# Patient Record
Sex: Female | Born: 1985 | Race: Black or African American | Hispanic: No | Marital: Single | Smoking: Current every day smoker
Health system: Southern US, Community
[De-identification: ages and names within clinical notes are randomized; demographics above are authoritative.]

## PROBLEM LIST (undated history)

## (undated) DIAGNOSIS — N611 Abscess of the breast and nipple: Secondary | ICD-10-CM

## (undated) HISTORY — PX: HERNIA REPAIR: SHX51

## (undated) HISTORY — DX: Abscess of the breast and nipple: N61.1

---
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
## 2005-05-20 ENCOUNTER — Emergency Department (HOSPITAL_COMMUNITY): Admission: EM | Admit: 2005-05-20 | Discharge: 2005-05-20 | Payer: Self-pay | Admitting: Emergency Medicine

## 2005-06-05 ENCOUNTER — Emergency Department (HOSPITAL_COMMUNITY): Admission: EM | Admit: 2005-06-05 | Discharge: 2005-06-05 | Payer: Self-pay | Admitting: Emergency Medicine

## 2006-08-10 ENCOUNTER — Emergency Department (HOSPITAL_COMMUNITY): Admission: EM | Admit: 2006-08-10 | Discharge: 2006-08-10 | Payer: Self-pay | Admitting: Family Medicine

## 2006-09-10 ENCOUNTER — Emergency Department (HOSPITAL_COMMUNITY): Admission: EM | Admit: 2006-09-10 | Discharge: 2006-09-10 | Payer: Self-pay | Admitting: Family Medicine

## 2007-04-10 ENCOUNTER — Emergency Department (HOSPITAL_COMMUNITY): Admission: EM | Admit: 2007-04-10 | Discharge: 2007-04-10 | Payer: Self-pay | Admitting: Emergency Medicine

## 2007-05-20 ENCOUNTER — Emergency Department (HOSPITAL_COMMUNITY): Admission: EM | Admit: 2007-05-20 | Discharge: 2007-05-20 | Payer: Self-pay | Admitting: Emergency Medicine

## 2007-07-14 ENCOUNTER — Ambulatory Visit (HOSPITAL_COMMUNITY): Admission: RE | Admit: 2007-07-14 | Discharge: 2007-07-14 | Payer: Self-pay | Admitting: Family Medicine

## 2007-08-04 ENCOUNTER — Ambulatory Visit (HOSPITAL_COMMUNITY): Admission: RE | Admit: 2007-08-04 | Discharge: 2007-08-04 | Payer: Self-pay | Admitting: Obstetrics & Gynecology

## 2007-10-16 ENCOUNTER — Ambulatory Visit: Payer: Self-pay | Admitting: Obstetrics and Gynecology

## 2007-10-16 ENCOUNTER — Inpatient Hospital Stay (HOSPITAL_COMMUNITY): Admission: AD | Admit: 2007-10-16 | Discharge: 2007-10-16 | Payer: Self-pay | Admitting: Family Medicine

## 2008-01-05 ENCOUNTER — Ambulatory Visit: Payer: Self-pay | Admitting: Physician Assistant

## 2008-01-05 ENCOUNTER — Inpatient Hospital Stay (HOSPITAL_COMMUNITY): Admission: AD | Admit: 2008-01-05 | Discharge: 2008-01-09 | Payer: Self-pay | Admitting: Obstetrics & Gynecology

## 2008-08-03 IMAGING — US US OB COMP +14 WK
1 series · 14 of 28 positions shown · non-contrast
Comparison: none

OBSTETRICAL ULTRASOUND:

 This ultrasound exam was performed in the [HOSPITAL] Ultrasound Department.  The OB US report was generated in the AS system, and faxed to the ordering physician.  This report is also available in [REDACTED] PACS.

[Series 1: us ob comp +14 wk · 0.22mm/px · 14 of 39 slices shown]
[im 2/39]
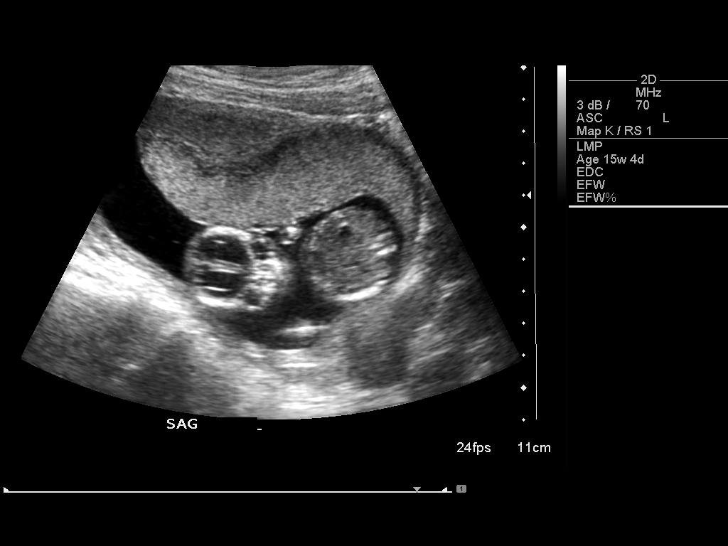
[im 5/39]
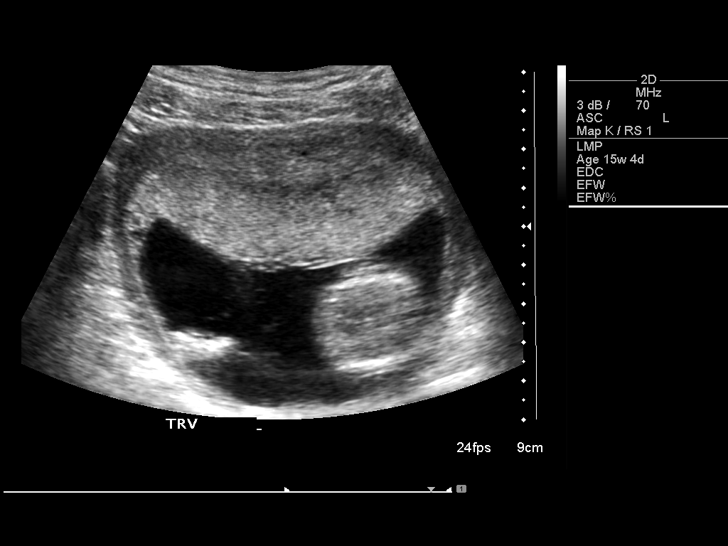
[im 8/39]
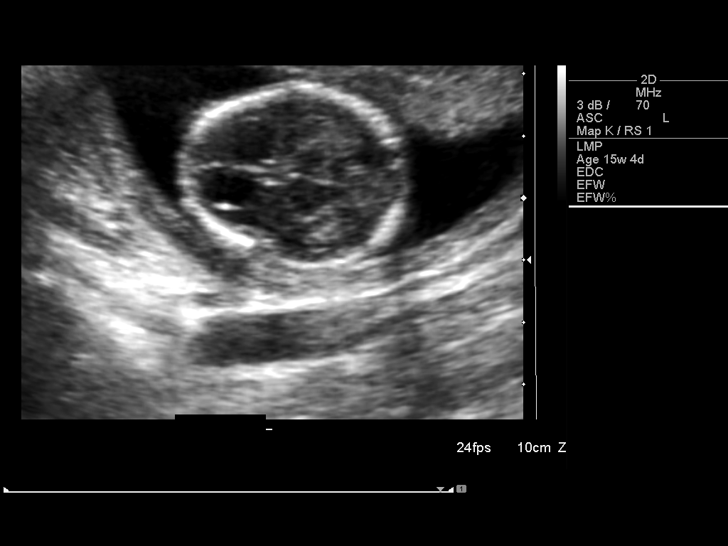
[im 10/39]
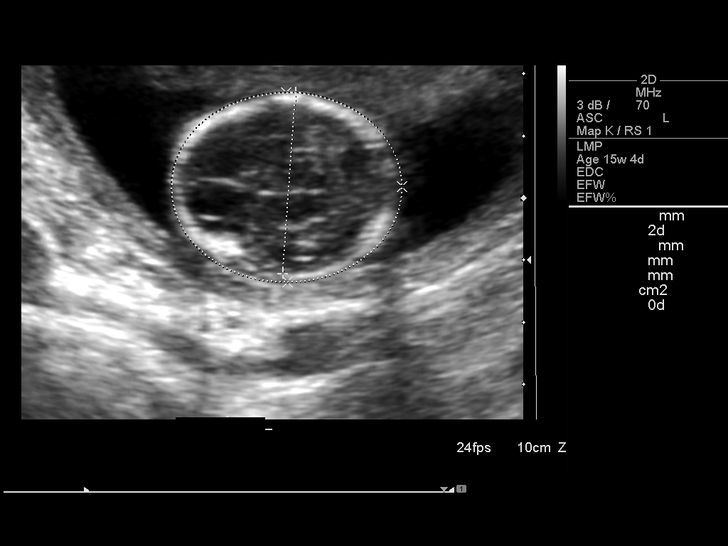
[im 13/39]
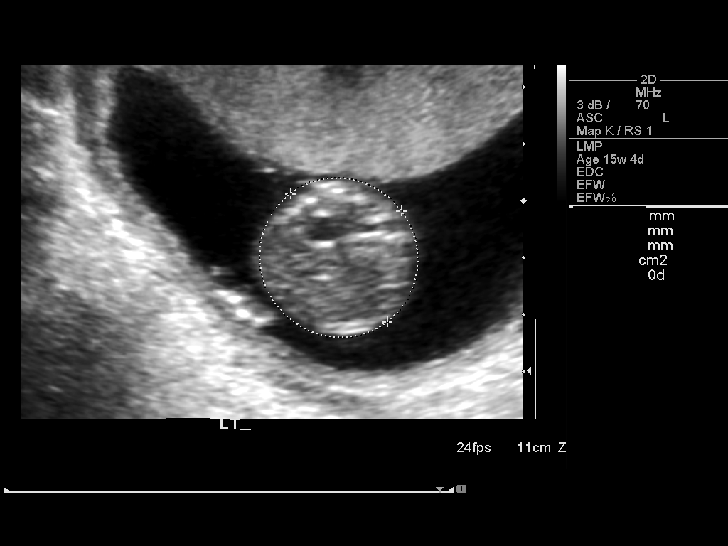
[im 16/39]
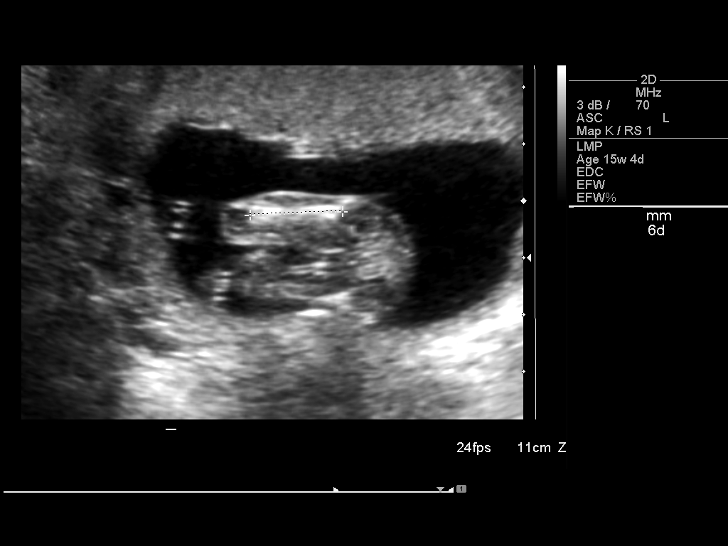
[im 19/39]
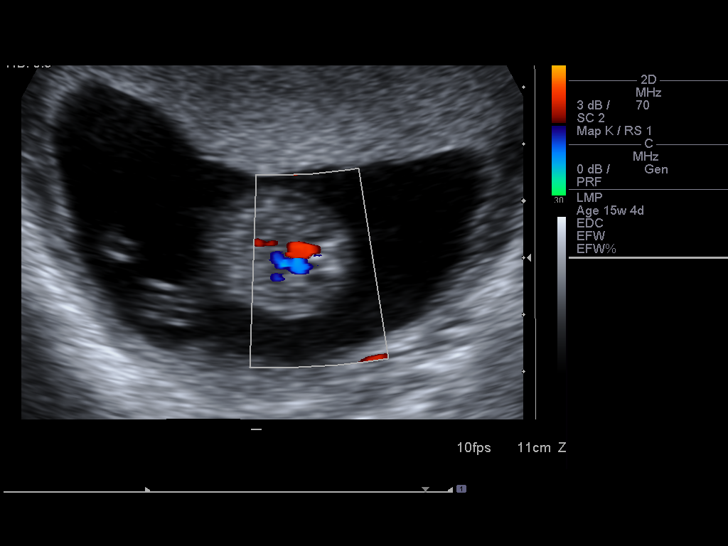
[im 22/39]
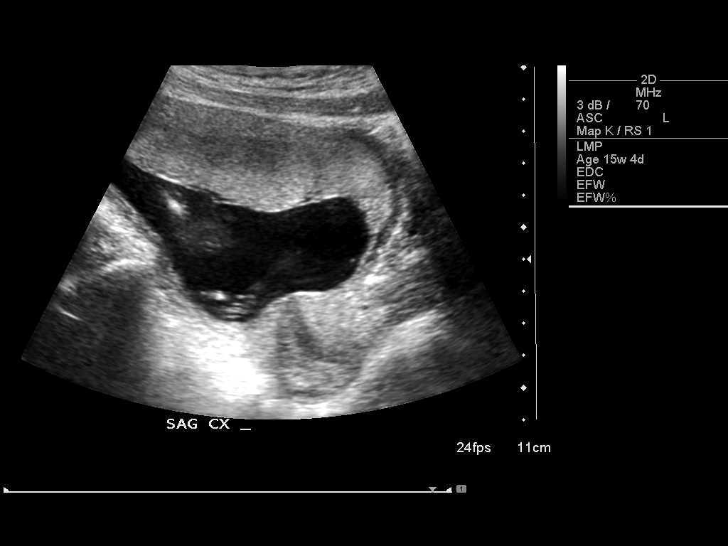
[im 24/39]
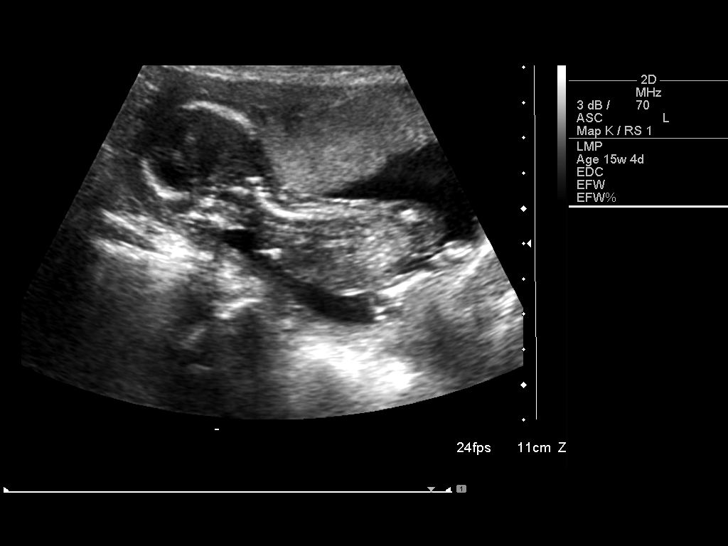
[im 27/39]
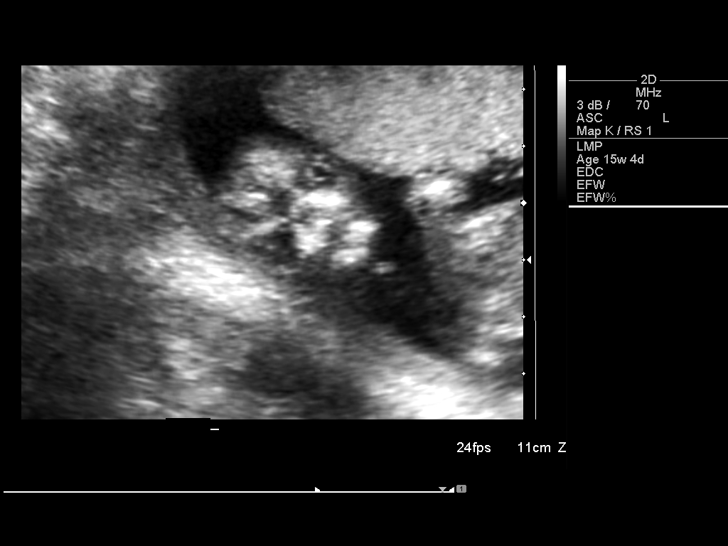
[im 30/39]
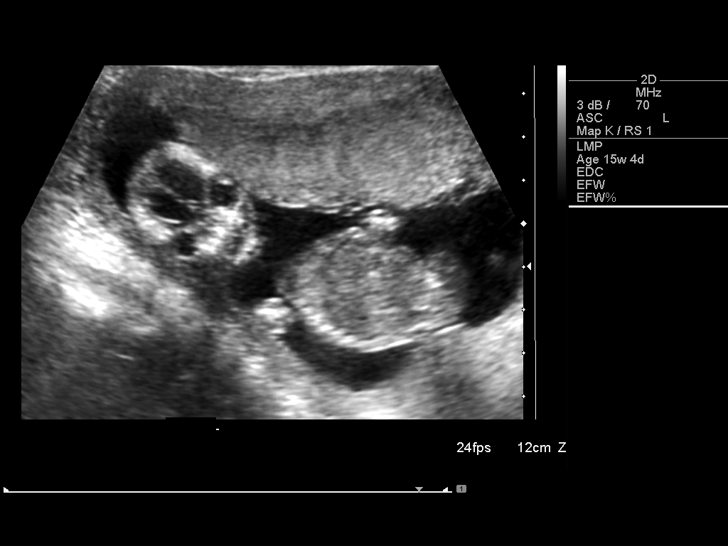
[im 33/39]
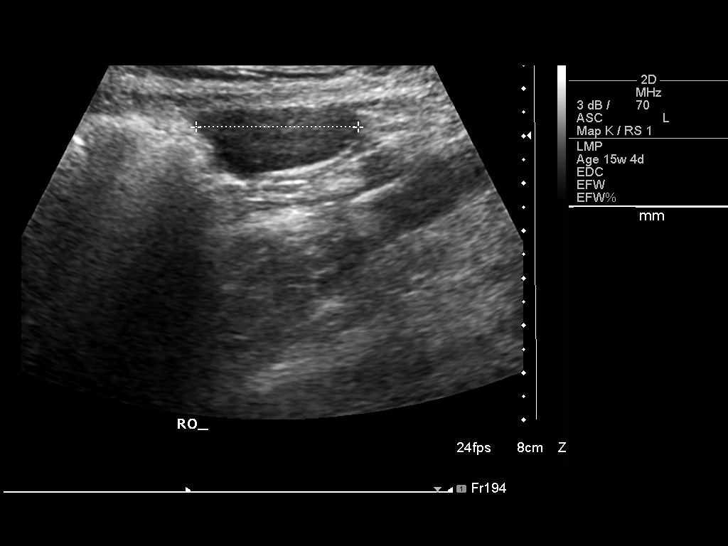
[im 36/39]
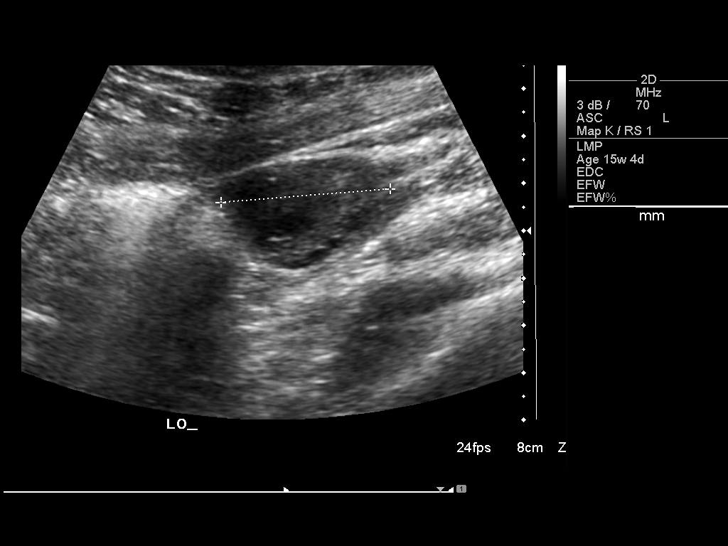
[im 39/39]
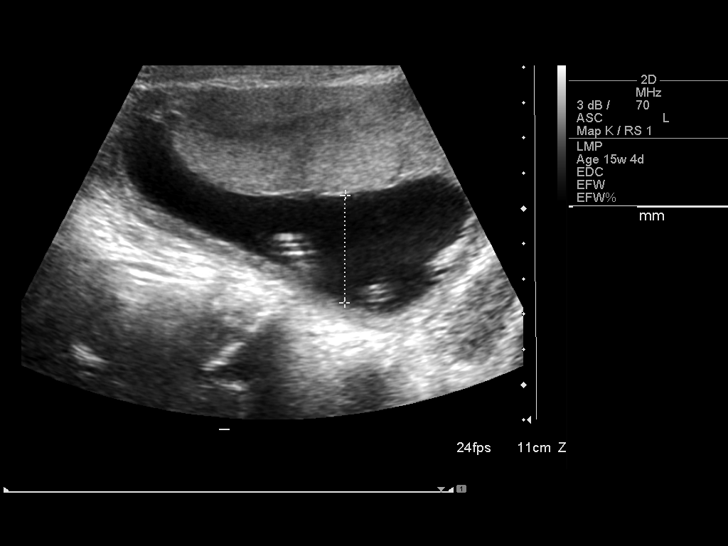

[14 of 28 positions shown; findings below may reference images not displayed]

IMPRESSION: See AS Obstetric US report.

## 2009-01-22 ENCOUNTER — Emergency Department (HOSPITAL_COMMUNITY): Admission: EM | Admit: 2009-01-22 | Discharge: 2009-01-22 | Payer: Self-pay | Admitting: Emergency Medicine

## 2010-11-01 ENCOUNTER — Encounter (HOSPITAL_BASED_OUTPATIENT_CLINIC_OR_DEPARTMENT_OTHER): Payer: Self-pay | Admitting: General Surgery

## 2011-01-21 LAB — CULTURE, ROUTINE-ABSCESS

## 2011-02-24 NOTE — Discharge Summary (Signed)
Dawn Stuart, Dawn Stuart              ACCOUNT NO.:  [id number]   MEDICAL RECORD NO.:  [id number]          PATIENT TYPE:  INP   LOCATION:  9128                          FACILITY:  [facility]   PHYSICIAN:  Allie Bossier, MD        DATE OF BIRTH:  10/23/1985   DATE OF ADMISSION:  01/05/2008  DATE OF DISCHARGE:  01/09/2008                               DISCHARGE SUMMARY   REASON FOR ADMISSION:  Induction of labor.   The patient is a 25 year old G1, P0 who came in for induction, for a  biophysical profile of 4/8.  The patient went on to have a viable female  infant with Apgar's of 8 at one minute and 9 at five minutes via low  forceps vaginal delivery.  This was done under epidural anesthesia.  The  forceps delivery was performed by Dr. Blima Rich.  Three-vessel  cord and placenta delivered spontaneously.  The patient did have a  fourth-degree laceration which was repaired by Dr. Mia Creek with 3-0  Vicryl.  Additionally, a vacuum-assist delivery was attempted but was  unsuccessful and that is why the low forceps delivery was performed.  The patient is bottle feeding her infant.  She plans to use Implanon for  birth control which she will get at her 6-week postpartum visit.   PERTINENT PRENATAL LABORATORY DATA:  Include Rh positive blood type,  rubella immune, and GBS negative.  The patient's discharge hemoglobin is  8.0 and discharge hematocrit 22.9, drawn on January 08, 2008.   PRENATAL PROCEDURES:  Included biophysical profile as previously  mentioned.   INTRAPARTUM PROCEDURES:  As mentioned before, low forceps delivery.   POSTPARTUM PROCEDURES:  None.   COMPLICATIONS:  Fourth-degree perineal laceration, repaired with 3-0  Vicryl.   DISCHARGE DIAGNOSIS:  Term pregnancy, delivered.   DISCHARGE INFORMATION:  Discharge date:  January 09, 2008.  Discharge activity:  Restricted in that the patient is not to have  intercourse for 6 weeks.  Diet:  Routine.  Medications include.  1. Percocet  5/325 one tab q.6 h. p.r.n. moderate-to-severe pain.  2. Ibuprofen 600 mg 1 tablet p.o. q.6 h. p.r.n. mild-to-moderate pain.  3. Senokot 2 tabs p.o. b.i.d. p.r.n. for constipation.  4. Iron 325 mg  p.o. t.i.d. for anemia.  5. Prenatal vitamin 1 tab p.o. daily for the next 6 weeks.   The patient's discharge status is well.  Discharge instructions are  routine.  The patient is discharged home and to follow up [facility] 6 weeks at  the [facility].      Asher Muir, MD      Allie Bossier, MD  Electronically Signed    SO/MEDQ  D:  01/09/2008  T:  01/10/2008  Job:  [id number]

## 2011-04-04 ENCOUNTER — Inpatient Hospital Stay (HOSPITAL_COMMUNITY)
Admission: EM | Admit: 2011-04-04 | Discharge: 2011-04-06 | DRG: 601 | Disposition: A | Discharge status: Home Health | Payer: Self-pay | Attending: General Surgery | Admitting: General Surgery

## 2011-04-04 DIAGNOSIS — F17200 Nicotine dependence, unspecified, uncomplicated: Secondary | ICD-10-CM | POA: Diagnosis present

## 2011-04-04 DIAGNOSIS — N61 Mastitis without abscess: Principal | ICD-10-CM | POA: Diagnosis present

## 2011-04-04 DIAGNOSIS — Z803 Family history of malignant neoplasm of breast: Secondary | ICD-10-CM

## 2011-04-04 DIAGNOSIS — E669 Obesity, unspecified: Secondary | ICD-10-CM | POA: Diagnosis present

## 2011-04-04 HISTORY — PX: BREAST SURGERY: SHX581

## 2011-04-04 LAB — BASIC METABOLIC PANEL
BUN: 3 mg/dL — ABNORMAL LOW (ref 6–23)
CO2: 23 mEq/L (ref 19–32)
Calcium: 8.9 mg/dL (ref 8.4–10.5)
Chloride: 107 mEq/L (ref 96–112)
Creatinine, Ser: 0.52 mg/dL (ref 0.50–1.10)
GFR calc Af Amer: >60 mL/min (ref >60)
GFR calc non Af Amer: >60 mL/min (ref >60)
Glucose, Bld: 106 mg/dL — ABNORMAL HIGH (ref 70–99)
Potassium: 4.2 mEq/L (ref 3.5–5.1)
Sodium: 140 mEq/L (ref 135–145)

## 2011-04-04 LAB — DIFFERENTIAL
Basophils Absolute: 0 10*3/uL (ref 0.0–0.1)
Basophils Relative: 1 % (ref 0–1)
Eosinophils Absolute: 0.2 10*3/uL (ref 0.0–0.7)
Eosinophils Relative: 2 % (ref 0–5)
Lymphocytes Relative: 28 % (ref 12–46)
Lymphs Abs: 1.8 10*3/uL (ref 0.7–4.0)
Monocytes Absolute: 0.5 10*3/uL (ref 0.1–1.0)
Monocytes Relative: 8 % (ref 3–12)
Neutro Abs: 4 10*3/uL (ref 1.7–7.7)
Neutrophils Relative %: 62 % (ref 43–77)

## 2011-04-04 LAB — CBC
HCT: 41.1 % (ref 36.0–46.0)
Hemoglobin: 14.1 g/dL (ref 12.0–15.0)
MCH: 32.6 pg (ref 26.0–34.0)
MCHC: 34.3 g/dL (ref 30.0–36.0)
MCV: 95.1 fL (ref 78.0–100.0)
Platelets: 248 10*3/uL (ref 150–400)
RBC: 4.32 MIL/uL (ref 3.87–5.11)
RDW: 13.5 % (ref 11.5–15.5)
WBC: 6.5 10*3/uL (ref 4.0–10.5)

## 2011-04-05 NOTE — H&P (Signed)
  NAMEPOPPI, SCANTLING NO.:  [id number]  MEDICAL RECORD NO.:  [id number]  LOCATION:  5121                         FACILITY:  [facility]  PHYSICIAN:  Wilmon Arms. Corliss Skains, M.D. DATE OF BIRTH:  04/08/1986  DATE OF ADMISSION:  04/04/2011 DATE OF DISCHARGE:                             HISTORY & PHYSICAL   CHIEF COMPLAINT:  Left breast pain.  HISTORY OF PRESENT ILLNESS:  This is a 25 year old female, who presents with a 2-week history of left breast mass that has been progressively enlarging.  It has developed in the large area with tenderness and erythema.  Over the last couple days, she has had skin breakdown with some purulent drainage.  She presents to the emergency department for evaluation.  PAST MEDICAL HISTORY:  None.  PAST SURGICAL HISTORY:  Umbilical hernia repair.  FAMILY HISTORY:  Her grandmother has had breast cancer, diabetes, and hypertension.  SOCIAL HISTORY:  The patient smokes a pack a day, drinks at least three times a week, smokes marijuana.  REVIEW OF SYSTEMS:  Otherwise negative.  MEDICATIONS:  Aspirin on a p.r.n. basis, Norplant.  ALLERGIES:  None.  PHYSICAL EXAMINATION:  GENERAL:  This is a well-developed, well- nourished female, in no apparent distress. VITAL SIGNS:  She is afebrile with a temperature of 98.2, heart rate 82, blood pressure 130/84, sats 99% NECK:  Larynx are midline. HEENT:  EOMI.  Sclerae anicteric. LUNGS:  Clear to auscultation bilaterally. HEART:  Regular rate and rhythm.  No murmur. BREASTS:  In the left lower outer quadrant left breast, there is a large area of cellulitis with central area of skin necrosis and purulent drainage.  The central area of fluctuance seems to be about 3 cm across.  LABS:  Sodium 140, potassium 4.2, chloride 107, bicarb 23, BUN 3, creatinine 0.52.  White count 6.5, hemoglobin 14.1, platelet count 248.  IMPRESSION:  Left breast abscess with surrounding cellulitis.  PLAN:  We will  proceed to the operating room for incision and drainage of the left breast abscess.  We will also admit the patient for IV antibiotics to the surrounding cellulitis.     Wilmon Arms. Corliss Skains, M.D.     MKT/MEDQ  D:  04/04/2011  T:  04/04/2011  Job:  [id number]  Electronically Signed by Manus Rudd M.D. on 04/05/2011 08:33:51 PM

## 2011-04-05 NOTE — Op Note (Signed)
  NAMEINZA, Dawn Stuart NO.:  [id number]  MEDICAL RECORD NO.:  [id number]  LOCATION:  5121                         FACILITY:  [facility]  PHYSICIAN:  Wilmon Arms. Corliss Skains, M.D. DATE OF BIRTH:  22-Nov-1985  DATE OF PROCEDURE:  04/04/2011 DATE OF DISCHARGE:                              OPERATIVE REPORT   PREOPERATIVE DIAGNOSIS:  Left breast abscess.  POSTOPERATIVE DIAGNOSIS:  Left breast abscess.  PROCEDURE:  Incision and drainage of left breast abscess.  SURGEON:  Wilmon Arms. Ameliarose Shark, MD  ANESTHESIA:  General.  INDICATIONS:  This is a 25 year old female who presents with a 2-week history of an enlarging breast mass that has become infected with some purulent drainage.  She presents now for surgical evaluation and treatment.  DESCRIPTION OF PROCEDURE:  The patient was brought to the operating room, placed in supine position on operating room table.  After an adequate level of general anesthesia was obtained, her left breast was prepped with Betadine and draped in sterile fashion.  Time-out was taken to assure the proper patient, proper procedure.  I opened the purulent area with a hemostat.  The purulent fluid was cultured and sent for microbiology.  I excised a 2-cm circle of necrotic skin.  We entered the abscess cavity.  This seemed to be relatively superficial.  We opened this widely and irrigated thoroughly.  I used a cautery to open the posterior wall abscess cavity and the tissue behind this area seems to be normal in appearance.  There does not seemed to be any deep tracking or further undermining of the abscess.  We inspected carefully for hemostasis.  We thoroughly irrigated with sterile saline.  The wound was packed with saline-soaked gauze.  Dry dressing was applied.  The patient was then extubated, brought to recovery in stable condition.  All sponge, instrument, and needle counts were correct.     Wilmon Arms. Corliss Skains, M.D.     MKT/MEDQ  D:   04/04/2011  T:  04/04/2011  Job:  [id number]  Electronically Signed by Manus Rudd M.D. on 04/05/2011 08:33:48 PM

## 2011-04-07 LAB — CULTURE, ROUTINE-ABSCESS

## 2011-04-09 LAB — ANAEROBIC CULTURE

## 2011-04-10 ENCOUNTER — Telehealth (INDEPENDENT_AMBULATORY_CARE_PROVIDER_SITE_OTHER): Payer: Self-pay | Admitting: General Surgery

## 2011-04-10 NOTE — Telephone Encounter (Signed)
Dawn Stuart from [facility] called to inform us of a delay in service. Dawn Stuart has asked them not to come out until Monday. If you have questions call her. Thanks.

## 2011-04-29 NOTE — Discharge Summary (Signed)
  Dawn Stuart, DRUM NO.:  [id number]  MEDICAL RECORD NO.:  [id number]  LOCATION:  5121                         FACILITY:  [facility]  PHYSICIAN:  Mary Sella. Andrey Campanile, MD     DATE OF BIRTH:  1985-11-10  DATE OF ADMISSION:  04/04/2011 DATE OF DISCHARGE:  04/06/2011                              DISCHARGE SUMMARY   HISTORY OF PRESENT ILLNESS:  Ms. Bracy is a 25 year old female presented with 2-week history of left breast mass, progressively enlarging.  She was seen by Dr. Corliss Skains and found to have an enlarged breast abscess. Decision was made that this would need an incision and drainage and admission for IV antibiotics.  SUMMARY OF HOSPITAL COURSE:  The patient was admitted on April 04, 2011. She had an incision and drainage of the left breast abscess performed by Dr. Corliss Skains, revealing significant purulent fluid, packing was placed. Postoperatively, the patient was placed on antibiotics, was tolerating dressing changes, and ultimately was stable for discharge by postop day #2.  DISCHARGE DIAGNOSES:  Left breast abscess status post incision and drainage.  DISCHARGE MEDICATIONS:  The patient was given a prescription for Bactrim DS 1 tablet twice daily and Percocet 1-2 tablets q.4 hours p.r.n. pain.  She was given instructions regarding dressing changes and asked to follow up in our clinic in approximately 2 weeks or p.r.n.     Brayton El, PA-C   ______________________________ Mary Sella. Andrey Campanile, MD    KB/MEDQ  D:  04/22/2011  T:  04/23/2011  Job:  [id number]  Electronically Signed by Brayton El  on 04/28/2011 04:14:11 PM Electronically Signed by Gaynelle Adu M.D. on 04/29/2011 11:26:00 PM

## 2011-05-12 ENCOUNTER — Encounter (INDEPENDENT_AMBULATORY_CARE_PROVIDER_SITE_OTHER): Payer: Self-pay | Admitting: Surgery

## 2011-05-14 ENCOUNTER — Encounter (INDEPENDENT_AMBULATORY_CARE_PROVIDER_SITE_OTHER): Payer: Self-pay | Admitting: Surgery

## 2011-07-02 LAB — URINALYSIS, ROUTINE W REFLEX MICROSCOPIC
Bilirubin Urine: NEGATIVE
Glucose, UA: NEGATIVE
Hgb urine dipstick: NEGATIVE
Ketones, ur: NEGATIVE
Nitrite: NEGATIVE
Protein, ur: NEGATIVE
Specific Gravity, Urine: <1.005 — ABNORMAL LOW
Urobilinogen, UA: 0.2
pH: 5.5

## 2011-07-02 LAB — FETAL FIBRONECTIN: Fetal Fibronectin: NEGATIVE

## 2011-07-02 LAB — URINE MICROSCOPIC-ADD ON

## 2011-07-06 LAB — CBC
HCT: 22.9 — ABNORMAL LOW
HCT: 26 — ABNORMAL LOW
HCT: 33.5 — ABNORMAL LOW
Hemoglobin: 11.5 — ABNORMAL LOW
Hemoglobin: 8 — ABNORMAL LOW
Hemoglobin: 9.2 — ABNORMAL LOW
MCHC: 34.4
MCHC: 34.9
MCHC: 35.4
MCV: 93.6
MCV: 94.5
MCV: 94.9
Platelets: 144 — ABNORMAL LOW
Platelets: 153
Platelets: 195
RBC: 2.42 — ABNORMAL LOW
RBC: 2.74 — ABNORMAL LOW
RBC: 3.58 — ABNORMAL LOW
RDW: 14
RDW: 14.2
RDW: 14.4
WBC: 15.2 — ABNORMAL HIGH
WBC: 16.8 — ABNORMAL HIGH
WBC: 8.4

## 2011-07-06 LAB — RPR: RPR Ser Ql: NONREACTIVE

## 2011-07-27 LAB — POCT URINALYSIS DIP (DEVICE)
Glucose, UA: 100 — AB
Hgb urine dipstick: NEGATIVE
Nitrite: NEGATIVE
Operator id: 239701
Protein, ur: NEGATIVE
Specific Gravity, Urine: 1.025
Urobilinogen, UA: 1
pH: 5.5

## 2011-07-27 LAB — POCT PREGNANCY, URINE
Operator id: 282151
Preg Test, Ur: POSITIVE

## 2011-07-29 LAB — URINALYSIS, ROUTINE W REFLEX MICROSCOPIC
Bilirubin Urine: NEGATIVE
Glucose, UA: NEGATIVE
Hgb urine dipstick: NEGATIVE
Ketones, ur: NEGATIVE
Nitrite: NEGATIVE
Protein, ur: NEGATIVE
Specific Gravity, Urine: 1.014
Urobilinogen, UA: 1
pH: 6

## 2011-07-29 LAB — COMPREHENSIVE METABOLIC PANEL
AST: 18
Albumin: 4
Alkaline Phosphatase: 64
Chloride: 109
Creatinine, Ser: 0.56
GFR calc Af Amer: >60
Potassium: 4
Total Bilirubin: 0.5
Total Protein: 7.2

## 2011-07-29 LAB — CBC
Platelets: 250
RDW: 14.9 — ABNORMAL HIGH
WBC: 6.8

## 2011-07-29 LAB — URINE CULTURE
Colony Count: NO GROWTH
Culture: NO GROWTH

## 2011-07-29 LAB — DIFFERENTIAL
Basophils Absolute: 0
Eosinophils Relative: 1
Lymphocytes Relative: 28
Monocytes Absolute: 0.4
Monocytes Relative: 5

## 2011-07-29 LAB — PREGNANCY, URINE: Preg Test, Ur: NEGATIVE

## 2011-07-29 LAB — URINE MICROSCOPIC-ADD ON

## 2011-11-25 ENCOUNTER — Emergency Department (HOSPITAL_COMMUNITY)
Admission: EM | Admit: 2011-11-25 | Discharge: 2011-11-25 | Disposition: A | Discharge status: Home / Self Care | Payer: Self-pay | Attending: Emergency Medicine | Admitting: Emergency Medicine

## 2011-11-25 ENCOUNTER — Encounter (HOSPITAL_COMMUNITY): Payer: Self-pay | Admitting: Emergency Medicine

## 2011-11-25 ENCOUNTER — Emergency Department (HOSPITAL_COMMUNITY): Payer: Self-pay

## 2011-11-25 DIAGNOSIS — N61 Mastitis without abscess: Secondary | ICD-10-CM

## 2011-11-25 DIAGNOSIS — F17200 Nicotine dependence, unspecified, uncomplicated: Secondary | ICD-10-CM | POA: Insufficient documentation

## 2011-11-25 LAB — BASIC METABOLIC PANEL
BUN: 4 mg/dL — ABNORMAL LOW (ref 6–23)
Chloride: 105 mEq/L (ref 96–112)
Creatinine, Ser: 0.65 mg/dL (ref 0.50–1.10)
GFR calc Af Amer: >90 mL/min (ref >90)
Glucose, Bld: 99 mg/dL (ref 70–99)
Potassium: 3.8 mEq/L (ref 3.5–5.1)

## 2011-11-25 LAB — URINALYSIS, ROUTINE W REFLEX MICROSCOPIC
Bilirubin Urine: NEGATIVE
Glucose, UA: NEGATIVE mg/dL
Hgb urine dipstick: NEGATIVE
Ketones, ur: NEGATIVE mg/dL
Protein, ur: NEGATIVE mg/dL
Urobilinogen, UA: 0.2 mg/dL (ref 0.0–1.0)

## 2011-11-25 LAB — CBC
HCT: 41.6 % (ref 36.0–46.0)
Hemoglobin: 14.4 g/dL (ref 12.0–15.0)
MCH: 32.7 pg (ref 26.0–34.0)
MCHC: 34.6 g/dL (ref 30.0–36.0)
MCV: 94.3 fL (ref 78.0–100.0)
RDW: 12.9 % (ref 11.5–15.5)

## 2011-11-25 LAB — PREGNANCY, URINE: Preg Test, Ur: NEGATIVE

## 2011-11-25 MED ORDER — SODIUM CHLORIDE 0.9 % IV SOLN
3.0000 g | Freq: Once | INTRAVENOUS | Status: AC
  Administered 2011-11-25: 3 g via INTRAVENOUS
  Filled 2011-11-25: qty 3

## 2011-11-25 MED ORDER — SODIUM CHLORIDE 0.9 % IV BOLUS (SEPSIS)
250.0000 mL | Freq: Once | INTRAVENOUS | Status: AC
  Administered 2011-11-25: 250 mL via INTRAVENOUS

## 2011-11-25 MED ORDER — SODIUM CHLORIDE 0.9 % IV SOLN: INTRAVENOUS | Status: DC

## 2011-11-25 MED ORDER — DOXYCYCLINE HYCLATE 100 MG PO TABS: 100.0000 mg | ORAL_TABLET | Freq: Two times a day (BID) | ORAL | Status: AC

## 2011-11-25 NOTE — ED Provider Notes (Signed)
History     CSN: [id number]  Arrival date & time 11/25/11  1356   First MD Initiated Contact with Patient 11/25/11 1804      Chief Complaint  Patient presents with  . Wound Infection    (Consider location/radiation/quality/duration/timing/severity/associated sxs/prior treatment) HPI  History reviewed. No pertinent past medical history.  Past Surgical History  Procedure Date  . Hernia repair   . Breast surgery   . Breast surgery 04/04/2011    lt breast absess    No family history on file.  History  Substance Use Topics  . Smoking status: Current Everyday Smoker -- 1.0 packs/day    Types: Cigarettes  . Smokeless tobacco: Current User  . Alcohol Use: Yes    OB History    Grav Para Term Preterm Abortions TAB SAB Ect Mult Living                  Review of Systems  Allergies  Review of patient's allergies indicates no known allergies.  Home Medications  No current outpatient prescriptions on file.  BP 123/85  Pulse 80  Temp(Src) 98 F (36.7 C) (Oral)  Resp 15  SpO2 100%  Physical Exam  ED Course  Procedures (including critical care time)   Labs Reviewed  BASIC METABOLIC PANEL  CBC  URINALYSIS, ROUTINE W REFLEX MICROSCOPIC  PREGNANCY, URINE   No results found.   1. Breast infection       MDM  0730 pm Report received from Dr. Deretha Emory. The patient is being moved to CDU waiting on the surgeon to evaluate her left breast mass/infection. Patient is also waiting on ultrasound to evaluate her mass. Dr. Luisa Hart will see the patient in the CDU after he gets out of the OR.  [address] ultrasound shows no abscess. Patient can go home on doxycycline. Followup as instructed by Dr. Luisa Hart.      Jethro Bastos, NP 11/25/11 2156

## 2011-11-25 NOTE — ED Notes (Signed)
Patient to go to ultrasound with NT.

## 2011-11-25 NOTE — ED Notes (Signed)
Pt st's she had a cyst removed from left breast in June, st's started as a abcess that was draining, spent 2 days in hosp for IV antibiotics.  Pt st's area started to become painful over past week and started draining last pm.

## 2011-11-25 NOTE — Discharge Instructions (Signed)
Mastitis   Mastitis is a bacterial infection of the breast tissue.  CAUSES   Bacteria causes infection by entering the breast tissue through cuts or openings in the skin. Typically, this occurs with breastfeeding due to cracked or irritated skin. It can be associated with plugged ducts. Nipple piercing can also lead to mastitis.  SYMPTOMS   In mastitis, an area of the breast becomes swollen, red, tender, and painful. You may notice you have a fever and swelling of the glands under your arm on that side. If the infection is allowed to progress, a collection of pus (abscess) may develop.  DIAGNOSIS   Your caregiver can diagnose mastitis based on your symptoms and upon examination. The diagnosis can be confirmed if pus can be expressed from the breast. This pus can be examined in the lab to determine which bacteria are present. If an abscess has developed, the fluid in the abscess can be removed with a needle. This is used to confirm the diagnosis and determine the bacteria present. In most cases, pus will not be present. Blood tests can be done to determine if your body is fighting a bacterial infection. Sometimes, a mammogram or ultrasound will be recommended to exclude other breast diseases including cancer.  Other rare forms of mastitis:   Tuberculosis mastitis is rare. The TB germ can affect the breast if it is present in some other part of the body. The breast may be slightly tender with a mass, but not tender or painful.   Syphilis of the nipple usually has an ulcer that is not tender.   Actinomycosis is a very rare bacterial infection of the breast that presents as a mass in the breast that is not tender or painful.   Phlebitis (inflammation of blood vessels) of the breast is an inflammation of the veins in the breast. It may be caused by tight fitting bras, surgery, or trauma to the breast.   Inflammatory carcinoma of the breast looks like mastitis because the breasts are red, swollen, or tender, but it  is a rare form of breast cancer.  TREATMENT   Antibiotic medication is used to treat the bacterial infection. Your caregiver will determine which bacteria are most likely to be causing the infection and select an antibiotic. This is sometimes changed based on the results of cultures, or if there is no response to the antibiotic selected. Antibiotics are usually given by mouth. If you are breastfeeding, it is important to continue to empty the breast. Your caregiver can tell you whether or not this milk is safe for your infant, or needs to be thrown away. Pain can usually be treated with medication.  HOME CARE INSTRUCTIONS    Take your antibiotics as directed. Finish them even if you start to feel better.   Only take over-the-counter or prescription medication for pain, discomfort, or fever as directed by your caregiver.   If breastfeeding, keep your nipples clean and dry. Your caregiver may tell you to stop nursing until he or she feels it is safe for your baby. Use a breast pump as instructed if forced to stop nursing.   Do not wear a tight bra. Wear a good support bra.   Empty the first breast completely before going to the other breast. If your baby is not emptying your breasts completely for some reason, use a breast pump to empty your breasts.   If you go back to work, pump your breasts while at work to stay in time   you have a fever.   Avoid having your breasts get overly filled with milk (engorged).  SEEK MEDICAL CARE IF:   You develop pus-like (purulent) discharge from the breast.   Your symptoms get worse.   You do not seem to be responding to your treatment within 2 days.  SEEK IMMEDIATE MEDICAL CARE IF:   You have a fever.   Your pain and swelling is getting worse.   You develop pain that is not controlled with medicine.   You develop a red line extending from the breast toward your armpit.  Document  Released: 09/28/2005 Document Revised: 06/10/2011 Document Reviewed: 05/18/2008 [facility] Patient Information 2012 [place], [place].  Call [facility] [phone]  For appointment to be seen in 7 - 10 days.

## 2011-11-25 NOTE — ED Notes (Signed)
Left breast infection has come back and she is leaking fluid x 1 week

## 2011-11-25 NOTE — Consult Note (Signed)
Reason for Consult:Breast abscess left mastitis Referring Physician: Leyda Stuart is an 26 y.o. female.  HPI:  Dawn Stuart has 1 week history of left breast swelling and redness.  History of left breast abscess last June.  The area is near the nipple and is draining.No fever or chills.  The area is very sore.  History reviewed. No pertinent past medical history.  Past Surgical History  Procedure Date  . Hernia repair   . Breast surgery   . Breast surgery 04/04/2011    lt breast absess    No family history on file.  Social History:  reports that she has been smoking Cigarettes.  She has been smoking about 1 pack per day. She uses smokeless tobacco. She reports that she drinks alcohol. She reports that she uses illicit drugs.  Allergies: No Known Allergies  Medications: I have reviewed the patient's current medications.  Results for orders placed during the hospital encounter of 11/25/11 (from the past 48 hour(s))  BASIC METABOLIC PANEL     Status: Abnormal   Collection Time   11/25/11  7:17 PM      Component Value Range Comment   Sodium 138  135 - 145 (mEq/L)    Potassium 3.8  3.5 - 5.1 (mEq/L)    Chloride 105  96 - 112 (mEq/L)    CO2 24  19 - 32 (mEq/L)    Glucose, Bld 99  70 - 99 (mg/dL)    BUN 4 (*) 6 - 23 (mg/dL)    Creatinine, Ser 9.56  0.50 - 1.10 (mg/dL)    Calcium 21.3  8.4 - 10.5 (mg/dL)    GFR calc non Af Amer >90  >90 (mL/min)    GFR calc Af Amer >90  >90 (mL/min)   CBC     Status: Normal   Collection Time   11/25/11  7:17 PM      Component Value Range Comment   WBC 7.4  4.0 - 10.5 (K/uL)    RBC 4.41  3.87 - 5.11 (MIL/uL)    Hemoglobin 14.4  12.0 - 15.0 (g/dL)    HCT 08.6  57.8 - 46.9 (%)    MCV 94.3  78.0 - 100.0 (fL)    MCH 32.7  26.0 - 34.0 (pg)    MCHC 34.6  30.0 - 36.0 (g/dL)    RDW 62.9  52.8 - 41.3 (%)    Platelets 223  150 - 400 (K/uL)   URINALYSIS, ROUTINE W REFLEX MICROSCOPIC     Status: Abnormal   Collection Time   11/25/11  7:38 PM   Component Value Range Comment   Color, Urine YELLOW  YELLOW     APPearance HAZY (*) CLEAR     Specific Gravity, Urine 1.020  1.005 - 1.030     pH 5.5  5.0 - 8.0     Glucose, UA NEGATIVE  NEGATIVE (mg/dL)    Hgb urine dipstick NEGATIVE  NEGATIVE     Bilirubin Urine NEGATIVE  NEGATIVE     Ketones, ur NEGATIVE  NEGATIVE (mg/dL)    Protein, ur NEGATIVE  NEGATIVE (mg/dL)    Urobilinogen, UA 0.2  0.0 - 1.0 (mg/dL)    Nitrite NEGATIVE  NEGATIVE     Leukocytes, UA NEGATIVE  NEGATIVE  MICROSCOPIC NOT DONE ON URINES WITH NEGATIVE PROTEIN, BLOOD, LEUKOCYTES, NITRITE, OR GLUCOSE <1000 mg/dL.  PREGNANCY, URINE     Status: Normal   Collection Time   11/25/11  7:38 PM  Component Value Range Comment   Preg Test, Ur NEGATIVE  NEGATIVE      [place] Chest  11/25/2011  *RADIOLOGY REPORT*  Clinical Data: A focal area of tenderness and drainage in the left breast.  CHEST ULTRASOUND  Comparison: None.  Findings: No discrete fluid collection is present.  Normal breast tissue is evident.  IMPRESSION: No definite fluid collection or abscess.  Recommend follow-up ultrasound examination at the breast center.  Original Report Authenticated By: Jamesetta Orleans. MATTERN, M.D.    Review of Systems  Constitutional: Negative for fever and chills.  HENT: Negative.   Eyes: Negative.   Respiratory: Negative.   Cardiovascular: Negative.   Gastrointestinal: Negative.   Genitourinary: Negative.   Skin: Negative.   Neurological: Negative.   Endo/Heme/Allergies: Negative.    Blood pressure 113/82, pulse 65, temperature 98 F (36.7 C), temperature source Oral, resp. rate 20, SpO2 100.00%. Physical Exam  Constitutional: She is oriented to person, place, and time. She appears well-developed and well-nourished.  HENT:  Head: Normocephalic and atraumatic.  Eyes: EOM are normal. Pupils are equal, round, and reactive to light.  Neck: Normal range of motion. Neck supple.  Respiratory:       Left breast at 3 oclock at nipple  [place]  [place] 1 cm opening min erythema mod tenderness.  No fluctuance  Musculoskeletal: Normal range of motion.  Neurological: She is alert and oriented to person, place, and time.  Skin: Skin is warm and dry.  Psychiatric: She has a normal mood and affect. Her behavior is normal. Judgment and thought content normal.   Breast U/S  No fluid collection in leftbreast Assessment/Plan: Left breast mastitis with probable drained abscess Discharge home on doxycycline for 10 days.  F/U [facility] 10 days.  [name] A. 11/25/2011, 9:30 PM

## 2011-11-25 NOTE — ED Notes (Signed)
States she is here due to pain in her left breast. Started last week. Had surgery last year (abscess).  Open wound 2cm across. No drainage noted. Rates pain as 6/10.

## 2011-11-25 NOTE — ED Provider Notes (Addendum)
History     CSN: [id number]  Arrival date & time 11/25/11  1356   First MD Initiated Contact with Patient 11/25/11 1804      Chief Complaint  Patient presents with  . Wound Infection    (Consider location/radiation/quality/duration/timing/severity/associated sxs/prior treatment) The history is provided by the patient.   patient is a [age]-year-old female with recurrent left breast infection starting about a week ago started draining yesterday. In June of 2012 patient underwent I&D of abscess in the same area questionable whether breast biopsy was done patient never followed up with [facility] surgery following the operation. Pain is 10 out of 10 at worse the last 24 hours. She's past medical history other than the breast surgery is negative.  History reviewed. No pertinent past medical history.  Past Surgical History  Procedure Date  . Hernia repair   . Breast surgery   . Breast surgery 04/04/2011    lt breast absess    No family history on file.  History  Substance Use Topics  . Smoking status: Current Everyday Smoker -- 1.0 packs/day    Types: Cigarettes  . Smokeless tobacco: Current User  . Alcohol Use: Yes    OB History    Grav Para Term Preterm Abortions TAB SAB Ect Mult Living                  Review of Systems  Constitutional: Negative for fever.  HENT: Negative for congestion, neck pain and neck stiffness.   Eyes: Negative for redness and visual disturbance.  Respiratory: Negative for cough and shortness of breath.   Cardiovascular: Negative for chest pain.  Gastrointestinal: Negative for abdominal pain.  Genitourinary: Negative for dysuria.  Musculoskeletal: Negative for back pain.  Skin: Positive for wound. Negative for rash.  Neurological: Negative for headaches.  Hematological: Negative for adenopathy.    Allergies  Review of patient's allergies indicates no known allergies.  Home Medications  No current outpatient prescriptions on  file.  BP 123/85  Pulse 80  Temp(Src) 98 F (36.7 C) (Oral)  Resp 15  SpO2 100%  Physical Exam  Nursing note and vitals reviewed. Constitutional: She is oriented to person, place, and time. She appears well-developed and well-nourished.  HENT:  Head: Normocephalic and atraumatic.  Mouth/Throat: Oropharynx is clear and moist.  Eyes: Conjunctivae are normal. Pupils are equal, round, and reactive to light.  Neck: Normal range of motion. Neck supple.  Cardiovascular: Normal rate, regular rhythm and normal heart sounds.   No murmur heard. Pulmonary/Chest: Effort normal and breath sounds normal.       Left breast with left lateral chest area all area of excoriated skin no purulent drainage now large area of erythema central area of fluctuance and induration  Abdominal: Soft. Bowel sounds are normal. There is no tenderness.  Musculoskeletal: Normal range of motion.  Neurological: She is alert and oriented to person, place, and time. No cranial nerve deficit. She exhibits normal muscle tone. Coordination normal.  Skin: Skin is warm. No rash noted. There is erythema.    ED Course  Procedures (including critical care time)   Labs Reviewed  BASIC METABOLIC PANEL  CBC   No results found.   1. Breast infection       MDM   CDU recurrent left breast infection status post breast biopsy and abscess drainage back in June of 2012 by [facility] surgery. Infection reoccurred one week ago started draining fluid last night. Following previous breast abscess I&D by general  surgery patient never followed up. Not sure if this biopsy was done but would suspect it was. Will start on Unasyn IV basic labs ordered will have surgery consulted.   Discussed with on call general surgery Dr. Luisa Hart. He is currently in surgery agrees with antibiotic choice of Unasyn and recommends that we get an ultrasound of the left breast to evaluate for distinct abscess or mastitis. He will see the patient in  consultation in the CDU  Medical screening examination/treatment/procedure(s) were conducted as a shared visit with non-physician practitioner(s) and myself.  I personally evaluated the patient during the encounter        Shelda Jakes, MD 11/25/11 1919  Shelda Jakes, MD 11/27/11 [phone]

## 2011-11-27 ENCOUNTER — Telehealth (INDEPENDENT_AMBULATORY_CARE_PROVIDER_SITE_OTHER): Payer: Self-pay | Admitting: General Surgery

## 2011-11-27 NOTE — Telephone Encounter (Signed)
I LEFT A MESSAGE ON PT'S ANSWERING MACHINE RE HIS FOLLOW-UP APPT WITH DR. Davina Poke ON 12-03-11/GY THIS WAS SECOND MESSAGE

## 2011-11-27 NOTE — ED Provider Notes (Signed)
Medical screening examination/treatment/procedure(s) were conducted as a shared visit with non-physician practitioner(s) and myself.  I personally evaluated the patient during the encounter  Orignianll seen by me and placed in CDU pending surgical consult. Unasyn started.   Shelda Jakes, MD 11/27/11 [phone]

## 2011-12-03 ENCOUNTER — Ambulatory Visit (INDEPENDENT_AMBULATORY_CARE_PROVIDER_SITE_OTHER): Payer: Self-pay | Admitting: Surgery

## 2011-12-03 ENCOUNTER — Encounter (INDEPENDENT_AMBULATORY_CARE_PROVIDER_SITE_OTHER): Payer: Self-pay | Admitting: Surgery

## 2011-12-03 VITALS — BP 122/86 | HR 76 | Temp 97.8°F | Resp 20 | Ht 63.0 in | Wt 203.0 lb

## 2011-12-03 DIAGNOSIS — N61 Mastitis without abscess: Secondary | ICD-10-CM

## 2011-12-03 NOTE — Patient Instructions (Signed)
Return if any drainage or pus.

## 2011-12-03 NOTE — Progress Notes (Signed)
Patient returns in followup after being seen in the emergency room on 2 13 124 left breast mastitis and abscess that had already drained on exam. She was placed on doxycycline and sent home. She is doing well.  Exam: Left breast at 3:00 is a 1 cm scab.  There is no redness, fluctuance or drainage.  Impression :  left mastitis which spontaneously drained abscess resolved  Plan: Return to clinic as needed.

## 2012-08-03 ENCOUNTER — Encounter (HOSPITAL_COMMUNITY): Payer: Self-pay

## 2012-08-03 ENCOUNTER — Emergency Department (HOSPITAL_COMMUNITY)
Admission: EM | Admit: 2012-08-03 | Discharge: 2012-08-03 | Disposition: A | Discharge status: Home / Self Care | Payer: Medicaid Other | Attending: Emergency Medicine | Admitting: Emergency Medicine

## 2012-08-03 ENCOUNTER — Emergency Department (HOSPITAL_COMMUNITY)
Admission: EM | Admit: 2012-08-03 | Discharge: 2012-08-03 | Disposition: A | Discharge status: Other Acute Inpatient Hospital | Payer: Medicaid Other | Source: Home / Self Care | Attending: Emergency Medicine | Admitting: Emergency Medicine

## 2012-08-03 DIAGNOSIS — N611 Abscess of the breast and nipple: Secondary | ICD-10-CM

## 2012-08-03 DIAGNOSIS — L0291 Cutaneous abscess, unspecified: Secondary | ICD-10-CM

## 2012-08-03 DIAGNOSIS — N61 Mastitis without abscess: Secondary | ICD-10-CM | POA: Insufficient documentation

## 2012-08-03 DIAGNOSIS — F17200 Nicotine dependence, unspecified, uncomplicated: Secondary | ICD-10-CM | POA: Insufficient documentation

## 2012-08-03 MED ORDER — SULFAMETHOXAZOLE-TRIMETHOPRIM 800-160 MG PO TABS: 1.0000 | ORAL_TABLET | Freq: Two times a day (BID) | ORAL | Status: DC

## 2012-08-03 MED ORDER — OXYCODONE-ACETAMINOPHEN 5-325 MG PO TABS: 1.0000 | ORAL_TABLET | ORAL | Status: DC | PRN

## 2012-08-03 MED ORDER — HYDROCODONE-ACETAMINOPHEN 5-325 MG PO TABS
ORAL_TABLET | ORAL | Status: AC
  Filled 2012-08-03: qty 2

## 2012-08-03 MED ORDER — HYDROCODONE-ACETAMINOPHEN 5-325 MG PO TABS
2.0000 | ORAL_TABLET | Freq: Once | ORAL | Status: AC
  Administered 2012-08-03: 2 via ORAL

## 2012-08-03 MED ORDER — TETANUS-DIPHTH-ACELL PERTUSSIS 5-2.5-18.5 LF-MCG/0.5 IM SUSP
0.5000 mL | Freq: Once | INTRAMUSCULAR | Status: AC
  Administered 2012-08-03: 0.5 mL via INTRAMUSCULAR
  Filled 2012-08-03: qty 0.5

## 2012-08-03 MED ORDER — LIDOCAINE HCL 2 % IJ SOLN
10.0000 mL | Freq: Once | INTRAMUSCULAR | Status: AC
  Administered 2012-08-03: 200 mg via INTRADERMAL

## 2012-08-03 NOTE — ED Notes (Signed)
I&D tray with Lidocaine at bedside. 

## 2012-08-03 NOTE — ED Notes (Signed)
2 week duration of breast pain swelling, reddness

## 2012-08-03 NOTE — ED Notes (Signed)
Pt here from [facility] for eval of abscess on left breast, denies drainage.

## 2012-08-03 NOTE — ED Provider Notes (Signed)
History     CSN: [id number]  Arrival date & time 08/03/12  1344   First MD Initiated Contact with Patient 08/03/12 1406      Chief Complaint  Patient presents with  . Abscess    (Consider location/radiation/quality/duration/timing/severity/associated sxs/prior treatment) Patient is a 26 y.o. female presenting with abscess. The history is provided by the patient. No language interpreter was used.  Abscess  This is a recurrent problem. The current episode started more than one week ago. The onset was gradual. The problem occurs occasionally. The problem has been gradually worsening. Affected Location: L breast. The problem is moderate. The abscess is characterized by redness, painfulness and swelling. The abscess first occurred at home. Pertinent negatives include no fever, no diarrhea and no vomiting.  26 yo female with recurrent L breast abscess.  States that she has had worsening pain and swelling to her L breast aproxomiately the same area that was I & D in the OR 1 year ago  by Dr. Harlon Flor.  Area is approximately 4 cm with surrounding cellulitis.    Past Medical History  Diagnosis Date  . Breast abscess     Left    Past Surgical History  Procedure Date  . Breast surgery   . Breast surgery 04/04/2011    lt breast absess  . Hernia repair     umb    Family History  Problem Relation Age of Onset  . Cancer Maternal Grandmother     breast    History  Substance Use Topics  . Smoking status: Current Every Day Smoker -- 1.0 packs/day    Types: Cigarettes  . Smokeless tobacco: Current User  . Alcohol Use: Yes    OB History    Grav Para Term Preterm Abortions TAB SAB Ect Mult Living                  Review of Systems  Constitutional: Negative.  Negative for fever.  HENT: Negative.   Eyes: Negative.   Respiratory: Negative.   Cardiovascular: Negative.   Gastrointestinal: Negative.  Negative for nausea, vomiting and diarrhea.  Neurological: Negative.     Psychiatric/Behavioral: Negative.   All other systems reviewed and are negative.    Allergies  Review of patient's allergies indicates no known allergies.  Home Medications   Current Outpatient Rx  Name Route Sig Dispense Refill  . IBUPROFEN 200 MG PO TABS Oral Take 400 mg by mouth every 6 (six) hours as needed. For pain      BP 125/87  Pulse 69  Temp 97.7 F (36.5 C) (Oral)  Resp 18  SpO2 97%  Physical Exam  Nursing note and vitals reviewed. Constitutional: She is oriented to person, place, and time. She appears well-developed and well-nourished.  HENT:  Head: Normocephalic and atraumatic.  Eyes: Conjunctivae normal and EOM are normal. Pupils are equal, round, and reactive to light.  Neck: Normal range of motion. Neck supple.  Cardiovascular: Normal rate.   Pulmonary/Chest: Effort normal.  Abdominal: Soft.  Musculoskeletal: Normal range of motion. She exhibits no edema and no tenderness.  Neurological: She is alert and oriented to person, place, and time. She has normal reflexes.  Skin: Skin is warm and dry.       4cm indurated area of abscess with surrounding cellulitis  Psychiatric: She has a normal mood and affect.    ED Course  INCISION AND DRAINAGE Date/Time: 08/03/2012 2:56 PM Performed by: Remi Haggard Authorized by: Remi Haggard Consent: Verbal consent obtained.  Written consent not obtained. Risks and benefits: risks, benefits and alternatives were discussed Consent given by: patient Patient understanding: patient states understanding of the procedure being performed Patient identity confirmed: verbally with patient, arm band, provided demographic data and hospital-assigned identification number Time out: Immediately prior to procedure a "time out" was called to verify the correct patient, procedure, equipment, support staff and site/side marked as required. Type: abscess Location: L breast. Local anesthetic: lidocaine 2% without  epinephrine Anesthetic total: 10 ml Patient sedated: no Scalpel size: 11 Needle gauge: 22 Incision type: single straight Complexity: simple Drainage: purulent Drainage amount: copious Wound treatment: wound left open Packing material: 1/4 in iodoform gauze Patient tolerance: Patient tolerated the procedure well with no immediate complications.   Spoke with Dr. Bettey Mare PA and he advised to drain and follow up with Dr. Harlon Flor in 2 weeks.  Patient will be instructed to pull the packing out herself tomorrow.  Appointment set for Nov 6 at 1:30pm.       (including critical care time)  Labs Reviewed - No data to display No results found.   No diagnosis found.    MDM  Large 4-5cm abscess to L breast I & D with purulent drainage copious amount. Patient instructed to pull packing out tomorrow, use hot compresses and squeeze 3-4 times a day.  Start bactrim today.  Use ibuprofen and percocet for pain.  Return for high fever, n/v.          Remi Haggard, NP 08/03/12 1701

## 2012-08-03 NOTE — ED Provider Notes (Addendum)
History     CSN: [id number]  Arrival date & time 08/03/12  1109   First MD Initiated Contact with Patient 08/03/12 1302      Chief Complaint  Patient presents with  . Breast Problem    (Consider location/radiation/quality/duration/timing/severity/associated sxs/prior treatment) HPI Comments: (Delayed entry, my apololigies). Did performed a transfer note prior, sending patient to the ED, WITH THE INTENTION OF CONSULTATION WITH SURGICAL TEAM  Dawn Stuart presented to [facility] complaining of swelling, tenderness, redness and purulent discharge for about 2 week duration from her left breast  I cant take it anymore..she has been applying warm compresses and trying at home with topical antibiotics, at this point she can barely touch her breast and pain is exacerbated with minimal movements..She denies any recent trauma or skin lesions, fevers, chills or headaches. She brings to our attention that she had to see a surgeon in the past almost a year ago, for another abscessed that was surgically intervened.  The history is provided by the patient.    Past Medical History  Diagnosis Date  . Breast abscess     Left    Past Surgical History  Procedure Date  . Breast surgery   . Breast surgery 04/04/2011    lt breast absess  . Hernia repair     umb    Family History  Problem Relation Age of Onset  . Cancer Maternal Grandmother     breast    History  Substance Use Topics  . Smoking status: Current Every Day Smoker -- 1.0 packs/day    Types: Cigarettes  . Smokeless tobacco: Current User  . Alcohol Use: Yes    OB History    Grav Para Term Preterm Abortions TAB SAB Ect Mult Living                  Review of Systems  Constitutional: Negative for fever, chills, activity change and appetite change.  Skin: Positive for color change and wound.    Allergies  Review of patient's allergies indicates no known allergies.  Home Medications   Current Outpatient Rx  Name Route Sig  Dispense Refill  . IBUPROFEN 200 MG PO TABS Oral Take 400 mg by mouth every 6 (six) hours as needed. For pain    . OXYCODONE-ACETAMINOPHEN 5-325 MG PO TABS Oral Take 1 tablet by mouth every 4 (four) hours as needed for pain. 15 tablet 0  . SULFAMETHOXAZOLE-TRIMETHOPRIM 800-160 MG PO TABS Oral Take 1 tablet by mouth every 12 (twelve) hours. 20 tablet 0    BP 131/82  Pulse 75  Temp 99.6 F (37.6 C) (Oral)  Resp 24  SpO2 100%  Physical Exam  Nursing note and vitals reviewed. Constitutional: She appears well-developed and well-nourished. She appears distressed.  Pulmonary/Chest: Left breast exhibits skin change and tenderness. Breasts are asymmetrical.    Skin: There is erythema.    ED Course  Procedures (including critical care time)  Labs Reviewed - No data to display No results found.   1. Breast abscess   2. Cellulitis and abscess of breast       MDM  Extensive cellulitis and breast abscess with "skin de orange appearance" transfer to the ED for surgical consult.        Jimmie Molly, MD 08/03/12 1350  Jimmie Molly, MD 08/04/12 [phone]

## 2012-08-03 NOTE — ED Notes (Signed)
Pt given extra supplies for dressing changes at home. Pt verbalized understanding of followup appt.

## 2012-08-04 NOTE — ED Provider Notes (Signed)
Medical screening examination/treatment/procedure(s) were performed by non-physician practitioner and as supervising physician I was immediately available for consultation/collaboration.  Jalaya Sarver T Hemi Chacko, MD 08/04/12 2228 

## 2012-08-17 ENCOUNTER — Ambulatory Visit (INDEPENDENT_AMBULATORY_CARE_PROVIDER_SITE_OTHER): Payer: Medicaid Other | Admitting: Surgery

## 2012-08-17 ENCOUNTER — Encounter (INDEPENDENT_AMBULATORY_CARE_PROVIDER_SITE_OTHER): Payer: Self-pay | Admitting: Surgery

## 2012-08-17 VITALS — BP 126/74 | HR 72 | Temp 97.3°F | Resp 16 | Ht 63.0 in | Wt 190.0 lb

## 2012-08-17 DIAGNOSIS — N611 Abscess of the breast and nipple: Secondary | ICD-10-CM | POA: Insufficient documentation

## 2012-08-17 DIAGNOSIS — N61 Mastitis without abscess: Secondary | ICD-10-CM

## 2012-08-17 NOTE — Progress Notes (Signed)
This patient is status post incision and drainage of a very large left breast abscess in June of 2012. She had a small recurrence in favor of this year which resolved spontaneously. One month ago she presented to the emergency department with a very swollen erythematous left breast. This was drained in the emergency department and packed with gauze. The gauze has been removed and the wound is healed. She still has a 2 and half centimeter area of firmness just under the skin. Overall the breast feels much better.  Filed Vitals:   08/17/12 1340  BP: 126/74  Pulse: 72  Temp: 97.3 F (36.3 C)  Resp: 16   Her left breast shows a healing incision. No surrounding cellulitis. The deep part of the breast feels soft and nontender. She has a 2 cm area of thickening just under the incision. It only goes about 1 cm deep.  Impression recurrent left breast abscess  Recommendations: Recommend wide excision of the remaining scar tissue as it appears to contain a chronic abscess. We can perform this under local anesthetic in the office. We'll schedule this for next week.The surgical procedure has been discussed with the patient.  Potential risks, benefits, alternative treatments, and expected outcomes have been explained.  All of the patient's questions at this time have been answered.  The likelihood of reaching the patient's treatment goal is good.  The patient understand the proposed surgical procedure and wishes to proceed.   Wilmon Arms. Corliss Skains, MD, [facility]  08/17/2012 2:04 PM

## 2012-08-23 ENCOUNTER — Ambulatory Visit (INDEPENDENT_AMBULATORY_CARE_PROVIDER_SITE_OTHER): Payer: Medicaid Other | Admitting: Surgery

## 2012-08-25 ENCOUNTER — Telehealth (INDEPENDENT_AMBULATORY_CARE_PROVIDER_SITE_OTHER): Payer: Self-pay

## 2012-08-25 NOTE — Telephone Encounter (Signed)
Close encounter 

## 2012-08-29 ENCOUNTER — Ambulatory Visit (INDEPENDENT_AMBULATORY_CARE_PROVIDER_SITE_OTHER): Payer: Medicaid Other | Admitting: General Surgery

## 2012-09-13 ENCOUNTER — Ambulatory Visit (INDEPENDENT_AMBULATORY_CARE_PROVIDER_SITE_OTHER): Payer: Medicaid Other | Admitting: Surgery

## 2012-10-13 ENCOUNTER — Encounter (INDEPENDENT_AMBULATORY_CARE_PROVIDER_SITE_OTHER): Payer: Self-pay | Admitting: Surgery

## 2014-06-22 ENCOUNTER — Emergency Department (HOSPITAL_COMMUNITY)
Admission: EM | Admit: 2014-06-22 | Discharge: 2014-06-22 | Disposition: A | Discharge status: Home / Self Care | Payer: Medicaid Other | Attending: Emergency Medicine | Admitting: Emergency Medicine

## 2014-06-22 ENCOUNTER — Encounter (HOSPITAL_COMMUNITY): Payer: Self-pay | Admitting: Emergency Medicine

## 2014-06-22 DIAGNOSIS — R21 Rash and other nonspecific skin eruption: Secondary | ICD-10-CM | POA: Diagnosis present

## 2014-06-22 DIAGNOSIS — Z8742 Personal history of other diseases of the female genital tract: Secondary | ICD-10-CM | POA: Insufficient documentation

## 2014-06-22 DIAGNOSIS — IMO0002 Reserved for concepts with insufficient information to code with codable children: Secondary | ICD-10-CM | POA: Diagnosis not present

## 2014-06-22 DIAGNOSIS — X58XXXA Exposure to other specified factors, initial encounter: Secondary | ICD-10-CM | POA: Diagnosis not present

## 2014-06-22 DIAGNOSIS — S20319A Abrasion of unspecified front wall of thorax, initial encounter: Secondary | ICD-10-CM

## 2014-06-22 DIAGNOSIS — Y939 Activity, unspecified: Secondary | ICD-10-CM | POA: Insufficient documentation

## 2014-06-22 DIAGNOSIS — Y929 Unspecified place or not applicable: Secondary | ICD-10-CM | POA: Insufficient documentation

## 2014-06-22 DIAGNOSIS — L089 Local infection of the skin and subcutaneous tissue, unspecified: Secondary | ICD-10-CM

## 2014-06-22 DIAGNOSIS — F17200 Nicotine dependence, unspecified, uncomplicated: Secondary | ICD-10-CM | POA: Insufficient documentation

## 2014-06-22 MED ORDER — SULFAMETHOXAZOLE-TRIMETHOPRIM 800-160 MG PO TABS: 1.0000 | ORAL_TABLET | Freq: Two times a day (BID) | ORAL | Status: AC

## 2014-06-22 MED ORDER — CEPHALEXIN 500 MG PO CAPS: 500.0000 mg | ORAL_CAPSULE | Freq: Four times a day (QID) | ORAL | Status: AC

## 2014-06-22 NOTE — Discharge Instructions (Signed)
Apply triple antibiotic ointment twice a day. Take both keflex and bactrim for infection. Follow up for recheck. Return if worsening.    Wound Care Wound care helps prevent pain and infection.  You may need a tetanus shot if:  You cannot remember when you had your last tetanus shot.  You have never had a tetanus shot.  The injury broke your skin. If you need a tetanus shot and you choose not to have one, you may get tetanus. Sickness from tetanus can be serious. HOME CARE   Only take medicine as told by your doctor.  Clean the wound daily with mild soap and water.  Change any bandages (dressings) as told by your doctor.  Put medicated cream and a bandage on the wound as told by your doctor.  Change the bandage if it gets wet, dirty, or starts to smell.  Take showers. Do not take baths, swim, or do anything that puts your wound under water.  Rest and raise (elevate) the wound until the pain and puffiness (swelling) are better.  Keep all doctor visits as told. GET HELP RIGHT AWAY IF:   Yellowish-white fluid (pus) comes from the wound.  Medicine does not lessen your pain.  There is a red streak going away from the wound.  You have a fever. MAKE SURE YOU:   Understand these instructions.  Will watch your condition.  Will get help right away if you are not doing well or get worse. Document Released: 07/07/2008 Document Revised: 12/21/2011 Document Reviewed: 02/01/2011 [facility] Patient Information 2015 [place], [place]. This information is not intended to replace advice given to you by your health care provider. Make sure you discuss any questions you have with your health care provider.

## 2014-06-22 NOTE — ED Provider Notes (Signed)
Medical screening examination/treatment/procedure(s) were performed by non-physician practitioner and as supervising physician I was immediately available for consultation/collaboration.   EKG Interpretation None      Makaylyn Sinyard, MD, [facility]   Ward Givens, MD 06/22/14 [phone]

## 2014-06-22 NOTE — ED Notes (Signed)
Pt presents with itching burning rash to center of chest x 3 days. Rough, scaly, erythematous wounds of varying size with raised borders to midsternal chest area. She applied otc itch cream with no relief.

## 2014-06-22 NOTE — ED Provider Notes (Signed)
CSN: [id number]     Arrival date & time 06/22/14  1232 History  This chart was scribed for non-physician practitioner, Jaynie Crumble, PA-C,working with Ward Givens, MD, by Karle Plumber, ED Scribe. This patient was seen in room TR07C/TR07C and the patient's care was started at 3:04 PM.  Chief Complaint  Patient presents with  . Rash   Patient is a 28 y.o. female presenting with rash. The history is provided by the patient. No language interpreter was used.  Rash Associated symptoms: no fever    HPI Comments:  Dawn Stuart is a 28 y.o. female who presents to the Emergency Department complaining of an itching, burning rash that started approximately three days ago. She reports the area started as what she believed to be a mosquito bite. She reports scratching at the site which caused it to scab, bleed and drain pus. She has never had a rash like this before. Pt reports applying hydrocortisone cream to the area and states it made it worse. She denies fever, chills, cough or congestion.   Past Medical History  Diagnosis Date  . Breast abscess     Left   Past Surgical History  Procedure Laterality Date  . Breast surgery    . Breast surgery  04/04/2011    lt breast absess  . Hernia repair      umb   Family History  Problem Relation Age of Onset  . Cancer Maternal Grandmother     breast   History  Substance Use Topics  . Smoking status: Current Every Day Smoker -- 1.00 packs/day    Types: Cigarettes  . Smokeless tobacco: Current User  . Alcohol Use: Yes   OB History   Grav Para Term Preterm Abortions TAB SAB Ect Mult Living                 Review of Systems  Constitutional: Negative for fever and chills.  HENT: Negative for congestion.   Respiratory: Negative for cough.   Skin: Positive for rash.    Allergies  Review of patient's allergies indicates no known allergies.  Home Medications   Prior to Admission medications   Not on File   Triage Vitals: BP  122/83  Pulse 87  Temp(Src) 97.9 F (36.6 C) (Oral)  Resp 16  SpO2 100% Physical Exam  Nursing note and vitals reviewed. Constitutional: She is oriented to person, place, and time. She appears well-developed and well-nourished.  HENT:  Head: Normocephalic and atraumatic.  Eyes: EOM are normal.  Neck: Normal range of motion. Neck supple.  Cardiovascular: Normal rate.   Pulmonary/Chest: Effort normal.  Musculoskeletal: Normal range of motion.  Neurological: She is alert and oriented to person, place, and time.  Skin: Skin is warm and dry.     Several scabbing erythematous lesions to anterior chest with surrounding erythema. Tender to palpation. No drainage  Psychiatric: She has a normal mood and affect. Her behavior is normal.    ED Course  Procedures (including critical care time) DIAGNOSTIC STUDIES: Oxygen Saturation is 100% on RA, normal by my interpretation.   COORDINATION OF CARE: 3:07 PM- Will prescribe antibiotic and advised pt to stop scratching and picking at rash. Advised pt to discontinue the hydrocortisone cream and start using Neosporin or Bacitracin ointment. Informed pt of return precautions. Pt verbalizes understanding and agrees to plan.  Medications - No data to display  Labs Review Labs Reviewed - No data to display  Imaging Review No results found.  EKG Interpretation None      MDM   Final diagnoses:  Abrasion of chest wall with infection, unspecified laterality, initial encounter    Pt with scabbing lesions to the chest, with surrounding erythema and tenderness. Suspect initially patient had bites from possible mosquito, that she scratched off. Now some infection is evident. Will treat with Keflex and Bactrim, history of prior abscess these, suspicious for MRSA. She is afebrile, nontoxic appearing, will follow with primary care doctor.   Filed Vitals:   06/22/14 1311  BP: 122/83  Pulse: 87  Temp: 97.9 F (36.6 C)  TempSrc: Oral  Resp: 16   SpO2: 100%     I personally performed the services described in this documentation, which was scribed in my presence. The recorded information has been reviewed and is accurate.    Lottie Mussel, PA-C 06/22/14 1630
